# Patient Record
Sex: Male | Born: 1935 | Race: White | Hispanic: No | Marital: Married
Health system: Southern US, Community
[De-identification: ages and names within clinical notes are randomized; demographics above are authoritative.]

---

## 2003-10-19 ENCOUNTER — Other Ambulatory Visit: Payer: Self-pay

## 2004-07-27 ENCOUNTER — Encounter: Payer: Self-pay | Admitting: Rheumatology

## 2005-04-14 ENCOUNTER — Emergency Department: Payer: Self-pay | Admitting: Internal Medicine

## 2005-07-18 ENCOUNTER — Ambulatory Visit: Payer: Self-pay | Admitting: Internal Medicine

## 2006-05-21 ENCOUNTER — Ambulatory Visit: Payer: Self-pay | Admitting: Internal Medicine

## 2007-06-23 ENCOUNTER — Inpatient Hospital Stay: Payer: Self-pay | Admitting: Internal Medicine

## 2007-08-17 ENCOUNTER — Inpatient Hospital Stay: Payer: Self-pay | Admitting: Internal Medicine

## 2007-08-17 ENCOUNTER — Other Ambulatory Visit: Payer: Self-pay

## 2008-10-22 ENCOUNTER — Ambulatory Visit: Payer: Self-pay | Admitting: Gastroenterology

## 2010-09-18 ENCOUNTER — Ambulatory Visit: Payer: Self-pay | Admitting: Cardiology

## 2010-10-10 ENCOUNTER — Encounter: Payer: Self-pay | Admitting: Cardiology

## 2010-10-28 ENCOUNTER — Encounter: Payer: Self-pay | Admitting: Cardiology

## 2010-11-26 ENCOUNTER — Inpatient Hospital Stay: Payer: Self-pay | Admitting: Internal Medicine

## 2010-11-29 ENCOUNTER — Inpatient Hospital Stay: Payer: Self-pay | Admitting: Gastroenterology

## 2010-12-01 ENCOUNTER — Encounter: Payer: Self-pay | Admitting: Cardiology

## 2010-12-28 ENCOUNTER — Encounter: Payer: Self-pay | Admitting: Cardiology

## 2011-09-07 ENCOUNTER — Ambulatory Visit: Payer: Self-pay | Admitting: Internal Medicine

## 2012-09-27 ENCOUNTER — Inpatient Hospital Stay: Payer: Self-pay | Admitting: Internal Medicine

## 2012-09-27 LAB — BASIC METABOLIC PANEL
Anion Gap: 9 (ref 7–16)
BUN: 32 mg/dL — ABNORMAL HIGH (ref 7–18)
Calcium, Total: 9.1 mg/dL (ref 8.5–10.1)
Chloride: 107 mmol/L (ref 98–107)
Creatinine: 1.5 mg/dL — ABNORMAL HIGH (ref 0.60–1.30)
EGFR (African American): 51 — ABNORMAL LOW
Sodium: 140 mmol/L (ref 136–145)

## 2012-09-27 LAB — CBC
MCH: 35.5 pg — ABNORMAL HIGH (ref 26.0–34.0)
MCHC: 34.8 g/dL (ref 32.0–36.0)
Platelet: 264 10*3/uL (ref 150–440)
RBC: 3.96 10*6/uL — ABNORMAL LOW (ref 4.40–5.90)

## 2012-09-28 LAB — PROTIME-INR
INR: 1.1
Prothrombin Time: 14.5 secs (ref 11.5–14.7)

## 2012-09-28 LAB — CBC WITH DIFFERENTIAL/PLATELET
Basophil #: 0.1 10*3/uL (ref 0.0–0.1)
Eosinophil %: 3 %
HCT: 34.9 % — ABNORMAL LOW (ref 40.0–52.0)
HGB: 12 g/dL — ABNORMAL LOW (ref 13.0–18.0)
Lymphocyte %: 28 %
MCH: 35 pg — ABNORMAL HIGH (ref 26.0–34.0)
MCHC: 34.3 g/dL (ref 32.0–36.0)
Neutrophil #: 5 10*3/uL (ref 1.4–6.5)
Neutrophil %: 58.3 %
RBC: 3.43 10*6/uL — ABNORMAL LOW (ref 4.40–5.90)
RDW: 13.9 % (ref 11.5–14.5)

## 2012-09-28 LAB — BASIC METABOLIC PANEL
Anion Gap: 8 (ref 7–16)
Chloride: 110 mmol/L — ABNORMAL HIGH (ref 98–107)
Co2: 24 mmol/L (ref 21–32)
Creatinine: 1.44 mg/dL — ABNORMAL HIGH (ref 0.60–1.30)
EGFR (Non-African Amer.): 46 — ABNORMAL LOW
Glucose: 139 mg/dL — ABNORMAL HIGH (ref 65–99)
Osmolality: 293 (ref 275–301)
Potassium: 4.1 mmol/L (ref 3.5–5.1)
Sodium: 142 mmol/L (ref 136–145)

## 2012-09-28 LAB — HEMOGLOBIN
HGB: 11.2 g/dL — ABNORMAL LOW (ref 13.0–18.0)
HGB: 10.7 g/dL — ABNORMAL LOW (ref 13.0–18.0)
HGB: 10.7 g/dL — ABNORMAL LOW (ref 13.0–18.0)

## 2012-09-29 LAB — CBC WITH DIFFERENTIAL/PLATELET
Eosinophil #: 0.1 10*3/uL (ref 0.0–0.7)
Eosinophil %: 1.2 %
HCT: 29.8 % — ABNORMAL LOW (ref 40.0–52.0)
Lymphocyte #: 1.2 10*3/uL (ref 1.0–3.6)
Lymphocyte %: 11.8 %
MCH: 35.6 pg — ABNORMAL HIGH (ref 26.0–34.0)
MCHC: 35.1 g/dL (ref 32.0–36.0)
MCV: 102 fL — ABNORMAL HIGH (ref 80–100)
Monocyte %: 8.8 %
Neutrophil #: 7.7 10*3/uL — ABNORMAL HIGH (ref 1.4–6.5)
Neutrophil %: 77.5 %
Platelet: 198 10*3/uL (ref 150–440)
RBC: 2.93 10*6/uL — ABNORMAL LOW (ref 4.40–5.90)

## 2012-09-29 LAB — HEMOGLOBIN
HGB: 10.4 g/dL — ABNORMAL LOW (ref 13.0–18.0)
HGB: 10.2 g/dL — ABNORMAL LOW (ref 13.0–18.0)

## 2012-09-29 LAB — BASIC METABOLIC PANEL
Anion Gap: 6 — ABNORMAL LOW (ref 7–16)
BUN: 30 mg/dL — ABNORMAL HIGH (ref 7–18)
Co2: 24 mmol/L (ref 21–32)
Creatinine: 1.31 mg/dL — ABNORMAL HIGH (ref 0.60–1.30)
EGFR (African American): 60 — ABNORMAL LOW
Glucose: 120 mg/dL — ABNORMAL HIGH (ref 65–99)
Osmolality: 291 (ref 275–301)
Potassium: 3.9 mmol/L (ref 3.5–5.1)

## 2012-09-30 LAB — CBC WITH DIFFERENTIAL/PLATELET
Basophil %: 0.6 %
Eosinophil %: 2.6 %
HCT: 27.6 % — ABNORMAL LOW (ref 40.0–52.0)
HGB: 9.7 g/dL — ABNORMAL LOW (ref 13.0–18.0)
Lymphocyte %: 14.4 %
MCH: 35.8 pg — ABNORMAL HIGH (ref 26.0–34.0)
MCHC: 35.2 g/dL (ref 32.0–36.0)
Neutrophil #: 6.7 10*3/uL — ABNORMAL HIGH (ref 1.4–6.5)
Neutrophil %: 72.1 %
Platelet: 180 10*3/uL (ref 150–440)
RBC: 2.71 10*6/uL — ABNORMAL LOW (ref 4.40–5.90)
RDW: 13.7 % (ref 11.5–14.5)
WBC: 9.3 10*3/uL (ref 3.8–10.6)

## 2012-09-30 LAB — BASIC METABOLIC PANEL
Anion Gap: 7 (ref 7–16)
Chloride: 110 mmol/L — ABNORMAL HIGH (ref 98–107)
Creatinine: 1.21 mg/dL (ref 0.60–1.30)
EGFR (African American): >60
Potassium: 3.3 mmol/L — ABNORMAL LOW (ref 3.5–5.1)

## 2012-09-30 LAB — POTASSIUM: Potassium: 3.8 mmol/L (ref 3.5–5.1)

## 2012-10-01 LAB — CBC WITH DIFFERENTIAL/PLATELET
Basophil #: 0 10*3/uL (ref 0.0–0.1)
Eosinophil #: 0.2 10*3/uL (ref 0.0–0.7)
Eosinophil %: 2.7 %
HCT: 28.2 % — ABNORMAL LOW (ref 40.0–52.0)
HGB: 9.9 g/dL — ABNORMAL LOW (ref 13.0–18.0)
MCHC: 35.1 g/dL (ref 32.0–36.0)
MCV: 102 fL — ABNORMAL HIGH (ref 80–100)
Monocyte %: 9.9 %
Neutrophil #: 6.6 10*3/uL — ABNORMAL HIGH (ref 1.4–6.5)
Neutrophil %: 73.7 %
RDW: 13.8 % (ref 11.5–14.5)

## 2012-10-01 LAB — BASIC METABOLIC PANEL
BUN: 21 mg/dL — ABNORMAL HIGH (ref 7–18)
EGFR (African American): >60

## 2012-10-03 LAB — CBC WITH DIFFERENTIAL/PLATELET
Basophil #: 0.1 10*3/uL (ref 0.0–0.1)
Basophil %: 1.2 %
HGB: 10.2 g/dL — ABNORMAL LOW (ref 13.0–18.0)
Lymphocyte #: 1.5 10*3/uL (ref 1.0–3.6)
Lymphocyte %: 21.8 %
MCH: 35.4 pg — ABNORMAL HIGH (ref 26.0–34.0)
MCV: 101 fL — ABNORMAL HIGH (ref 80–100)
Monocyte #: 0.8 x10 3/mm (ref 0.2–1.0)
Monocyte %: 11.4 %
Neutrophil #: 4.1 10*3/uL (ref 1.4–6.5)
Platelet: 262 10*3/uL (ref 150–440)
RDW: 14.1 % (ref 11.5–14.5)
WBC: 6.7 10*3/uL (ref 3.8–10.6)

## 2012-10-03 LAB — URINALYSIS, COMPLETE
Glucose,UR: NEGATIVE mg/dL (ref 0–75)
Nitrite: NEGATIVE
Ph: 5 (ref 4.5–8.0)
RBC,UR: 5 /HPF (ref 0–5)
WBC UR: 8 /HPF (ref 0–5)

## 2012-10-05 LAB — URINE CULTURE

## 2012-11-26 DEATH — deceased

## 2014-06-18 NOTE — Op Note (Signed)
PATIENT NAME:  Michael Barnes, Michael Barnes MR#:  [id number] DATE OF BIRTH:  [birth year]  DATE OF PROCEDURE:  09/28/2012  PREOPERATIVE DIAGNOSES:  GI bleed.   POSTOPERATIVE DIAGNOSIS:  GI bleed.  PROCEDURE PERFORMED:  1.  Selective injection of the middle colic artery.  2.  Selective injection of the right colic artery.  3.  Embolization of the middle colic artery using 1 mL of 3 to 500 micron PVC beads.   PROCEDURE PERFORMED BY:  Levora Dredge, MD  SEDATION:  Fentanyl 2 mg plus fentanyl 100 mcg administered IV. Continuous ECG, pulse oximetry and cardiopulmonary monitoring was performed throughout the entire procedure by the interventional radiology nurse. Total sedation time is approximately 1 hour 20 minutes.   ACCESS:  A 5 French sheath, right common femoral artery.   FLUOROSCOPY TIME:  Approximately 8 minutes.   CONTRAST USED:  Isovue 80 mL.   INDICATIONS: Michael Barnes is a 81 year old gentleman who presented to the hospital with a significant GI bleed. Over the course of the night, his bleeding has not slowed. He is now showing increasing hemodynamic changes and a decrease in his crit. Bleeding scan is positive for the hepatic flexure, and he is therefore undergoing evaluation for possible embolization. The risks and benefits were described to the patient. All questions have been answered. The patient agrees for [place] to proceed.   DESCRIPTION OF PROCEDURE:  The patient is taken to special procedures and placed in the supine position. After adequate sedation is achieved, the right groin is prepped and draped in sterile fashion. Ultrasound is placed in a sterile sleeve. Ultrasound is utilized secondary to lack of appropriate landmarks and to avoid vascular injury. Under direct ultrasound visualization, the common femoral artery is identified. It is echolucent, homogeneous, indicating patency. Image is recorded for the permanent record. A micropuncture needle is used to access the anterior wall  under direct visualization. Microwire followed by micro-sheath, J-wire followed by a 5 French sheath and 5 French pigtail catheter. Lateral projection of the aorta is obtained with the pigtail catheter positioned in approximately T12, and the origin of the SMA is identified.   The Glidewire is then advanced, and the pigtail catheter is exchanged for a VS-1 catheter. VS-1 catheter is then used to engage the SMA, and the wire is advanced. The VS1 is then advanced  down to the SMA. Selective injection of the SMA is made in the AP projection. Right colic and middle colic arteries are identified. The middle colic is selected first and the catheter is advanced out into the midportion of the middle colic, and magnified images of the hepatic flexure are taken. Blush actually is identified, and 1 mL of 3 to 500 micron PVC beads is then infused. Followup angiography demonstrates a moderate reduction in the flow, and there is no longer a blush noted on hand injection.   The catheter is then repositioned into the right colic, and hand injection of contrast is utilized to demonstrate the distal vessels. No blushes are identified on this imaging, and therefore the procedure is terminated, having identified a bleeding source. I see no reason or indication to evaluate the left colon, as this was not the area found to be bleeding by the scan.   The catheter is removed over a wire, oblique view is obtained, and a Mynx device is deployed without difficulty. Palpable pulses are maintained at the completion of the procedure in the patient's right foot.   INTERPRETATION: Initial views of the SMA demonstrate normal pattern.  No evidence of hemodynamically significant atherosclerotic changes. On selective injection of the SMA, there does appear to be a bleeding focus, and this is treated with an injection of beads, and resolution of this area. Right injection does not demonstrate any bleeding.   SUMMARY: Successful embolization  for treatment of GI bleed.    ____________________________ Renford DillsGregory G. Shaylyn Bawa, MD ggs:mr D: 09/28/2012 16:24:29 ET T: 09/28/2012 21:25:33 ET JOB#: [id number]  cc: Renford DillsGregory G. Brean Carberry, MD, <Dictator> Hope PigeonVaibhavkumar G. Elisabeth PigeonVachhani, MD Dow AdolphMatthew Rein, MD   Renford DillsGREGORY G Kenyatta Gloeckner MD ELECTRONICALLY SIGNED 10/07/2012 8:50

## 2014-06-18 NOTE — Discharge Summary (Signed)
PATIENT NAME:  Michael Barnes, Michael R MR#:  [id number] DATE OF BIRTH:  Sep 27, 1931  DATE OF ADMISSION:  09/27/2012 DATE OF DISCHARGE: 10/03/2012    DIAGNOSES AT TIME OF DISCHARGE:  1. Acute lower gastrointestinal bleed, most likely secondary to diverticulosis.  2. Embolization of the middle colic artery using polyvinyl chloride beads.  3. Type 2 diabetes.  4. Parkinson's disease.  5. Confusion, mild dementia.   CHIEF COMPLAINT: Acute bright red blood per rectum.    HISTORY OF PRESENT ILLNESS: Michael Barnes is a 81 year old male who presented to the hospital after having 2 episodes of bright red blood per rectum. The patient also had been complaining of some cramping abdominal pain and went to the bathroom and stated that he passed a large amount of blood, and the patient also reports a sense of weakness. Denies any fevers, chills, cough, chest pain or shortness of breath.   PAST MEDICAL HISTORY: Significant for:  1. Prostate cancer.  2. Osteoarthritis.  3. Type 2 diabetes.  4. Gout.  5. Hypertension.  6. History of CAD status post stent placement.  7. History of GI bleed with multiple transfusions in the past.   PHYSICAL EXAMINATION:  VITAL SIGNS: Temperature 97.6, pulse was 80, respirations 20, blood pressure 102/45, O2 saturation is 96% on room air.  GENERAL: He was not in distress.  HEENT: [place], AT.  NECK: Supple.  HEART: S1, S2. No murmurs or rubs.  LUNGS: Clear to auscultation.  ABDOMEN: Soft, nontender.  EXTREMITIES: No edema.   LABORATORY STUDIES: Sugar was 150, BUN 32, creatinine 1.5, sodium 140, potassium 4.6, chloride 107, bicarbonate 24. Troponin less than 0.02. WBC count 9.4, hemoglobin 14, hematocrit 40.3 and platelets 264.   HOSPITAL COURSE: The patient was seen in consultation by Dr. Gilda CreaseSchnier and underwent selective injection of the middle colic artery and selective injection of the right colic artery and embolization of the middle colic artery using 1 mL of 3 to 500  micron PVC beads. The patient tolerated the procedure well. He was also seen by gastroenterologist, Dr. Dow AdolphMatthew Rein. The patient remained stable post procedure, and his hemoglobin, although it went down to 9.7, subsequently picked back up to 10.2. His diet was also advanced. He did have some episodes of confusion, which appeared to get better. Urinalysis was negative for infection. The patient did receive some Haldol for episodic confusion, but was stable at the time of discharge. He was discharged in stable condition on the following medications.   DISCHARGE MEDICATIONS:  1. Allopurinol 300 mg once a day.  2. Gemfibrozil 600 mg b.i.d. 3. Oxybutynin 5 mg b.i.d. 4. Glipizide 1.25 mg once a day.  5. Carbidopa/levodopa 25/100 t.i.d. with meals. 6. Ranitidine 150 mg q.12.  7. Acetaminophen 325 every 4 to 6 hours p.r.n. for temperature greater 100.4.   FOLLOWUP: He has been advised to follow up with Dr. Sampson GoonFitzgerald in 1 to 2 weeks. Also advised to follow up with Dr. Gilda CreaseSchnier in 1 to 2 weeks and keep his followup appointment with orthopedics as scheduled.   TOTAL TIME TAKEN FOR DISCHARGE AND COORDINATION OF CARE: 45 minutes.   ____________________________ Barbette ReichmannVishwanath Demere Dotzler, MD vh:OSi D: 10/03/2012 13:18:47 ET T: 10/03/2012 13:33:18 ET JOB#: [id number]  cc: Barbette ReichmannVishwanath Gudrun Axe, MD, <Dictator> Barbette ReichmannVISHWANATH Valentina Alcoser MD ELECTRONICALLY SIGNED 10/06/2012 18:47

## 2014-06-18 NOTE — Consult Note (Signed)
PATIENT NAME:  Michael Barnes, Michael R MR#:  [id number] DATE OF BIRTH:  1932-02-01  DATE OF CONSULTATION:  09/28/2012  REFERRING PHYSICIAN:  Hilda LiasVivek Sainani, MD CONSULTING PHYSICIAN:  Dow AdolphMatthew Yancy Knoble, MD  REASON FOR CONSULTATION:  Rectal bleeding.  HISTORY OF PRESENT ILLNESS:  Michael Barnes is a 81 year old male with a past medical history notable for prostate cancer, diabetes, coronary disease, status post stent, history of multiple lower GI bleeds requiring transfusion, last bleed in 2012, now presenting again for rectal bleeding.  This history is per the wife as Michael Barnes is unable to give a history at this time.  Per the wife, they were out eating dinner at a restaurant yesterday evening and the patient developed sudden onset of rectal bleeding.  She brought immediately to the emergency room.  In the emergency room, he continued to have several large episodes of rectal bleeding.  He was admitted to the intensive care unit.  He had another 1 to 2 further episodes of bleeding overnight and into the morning.  He did remain hemodynamically stable.  His hemoglobin dropped from 14 to 12 during the course of this period.  He did go for embolization this morning.  The embolization was successful for a treated embolization of the bleeding source in the vascular territory consistent with the hepatic flexure.    Of note, Michael Barnes has had several previous episodes of hematochezia.  He has been seen by Dr. Marva PandaSkulskie for this.  His last episode was in 2012.  It appears, based on the records, that his last colonoscopy was in 2010 and he did have multiple diverticula at this point but they were unable to locate a source of the bleeding at that time.    PAST MEDICAL HISTORY 1.  Prostate cancer. 2.  OA. 3.  DM. 4.  Gout. 5.  Hypertension.   6.  Coronary disease. 7.  Recurrent lower GI bleeding.   8.  Diverticulosis.    ALLERGIES:  PROTONIX, ANAPHYLAXIS.  SOCIAL HISTORY:  No alcohol, tobacco or  recreational drugs.  FAMILY HISTORY:  No family history of GI malignancy that the wife is aware of.   ADMISSION MEDICATIONS:  Ranitidine 300 q.h.s., oxybutynin 5 mg b.i.d., glipizide 1.25 daily, gemfibrozil 600 b.i.d., carbidopa/levodopa 25/100 half tab b.i.d., allopurinol 300 mg daily, acetaminophen 500 mg daily  REVIEW OF SYSTEMS:  Unable to obtain due to the patient recovering from sedation.  PHYSICAL EXAM VITAL SIGNS:  Current blood pressure 158/72, heart rate is 94, temperature is 97.5, he is 99% on room air.   GENERAL: Lethargic, alert and oriented times 1.  NAD. HEENT: Normocephalic/atraumatic. Extraocular movements are intact. Anicteric. NECK: Soft, supple. JVP appears normal. No adenopathy. CHEST: Clear to auscultation. No wheeze or crackle. Respirations unlabored. HEART: Regular. No murmur, rub, or gallop.  Normal S1 and S2. ABDOMEN: Hypogastric surgical scar, normoactive bowel sounds, soft, no rebound or guarding.   EXTREMITIES: No swelling or edema, compression in the right groin at the site of embolization. SKIN: No rash or lesion. Skin color, texture, turgor normal. NEUROLOGICAL: Grossly intact. PSYCHIATRIC: Normal tone and affect. MUSCULOSKELETAL: No joint swelling or erythema.   LABORATORY DATA:   Sodium 142, potassium 4.1, BUN 35, creatinine 1.4, bicarb 24, chloride 110.  Troponin is negative.  White count 8.6, current hemoglobin 11.2, platelets are 214.    IMAGING:  Tagged red blood cell scan shows likely bleeding source in the hepatic flexure.  ASSESSMENT AND PLAN:  Lower gastrointestinal bleeding:  This is likely a diverticular  bleed based on the patient's previous history.  Fortunately, he has remained hemodynamically stable and his hemoglobin has only declined from 14 to 11 so far.  The decision was made to send him for an embolization given the unprepped nature of the colon and the difficulty with visualization in the setting of stool and blood.  This was a successful  procedure with embolization in the area of the right hepatic artery.  At this point, we will continue to follow his hemoglobin and his hemodynamics.  If he continues to have bleeding or further drop in his hemoglobin beyond what would be expected, then we would proceed with a colonoscopy prep and a colonoscopy.  If he does not continue to have bleeding, then likely a colonoscopy would not be indicated during this hospital admission.  Further management pending the clinical course at this time.    Thank you for the consult.  We will continue to follow.   ____________________________ Dow Adolph, MD mr:cs D: 09/28/2012 13:52:00 ET T: 09/28/2012 14:18:31 ET JOB#: [id number]  cc: Dow Adolph, MD, <Dictator> Kathalene Frames MD ELECTRONICALLY SIGNED 09/29/2012 22:59

## 2014-06-18 NOTE — H&P (Signed)
PATIENT NAME:  Michael Barnes, Michael Barnes MR#:  [id number] DATE OF BIRTH:  12-02-1931  DATE OF ADMISSION:  09/27/2012  PRIMARY CARE PHYSICIAN:  Dr. Clydie Braun  CHIEF COMPLAINT:  Acute bright red blood per rectum.   HISTORY OF PRESENT ILLNESS: This is a 81 year old male who presented to the hospital after having 2 episodes of bright red blood per rectum. The patient apparently had gone out to dinner with his wife, was having some crampy abdominal pain, went to the bathroom and passed a large amount of blood, which was bright in nature and also consistent with a Jell-O like consistency. The patient has a previous history of a diverticular bleed with multiple transfusions in the past. His wife brought him to the ER. On his way to the hospital, patient had another episode of bright red blood per rectum. The patient presently denies any chest pain, any shortness of breath, any nausea, vomiting, abdominal pain, fevers, chills, cough, any other associated symptoms. Hospitalist services were contacted for further treatment and evaluation.   REVIEW OF SYSTEMS: CONSTITUTIONAL: No documented fever. No weight gain or weight loss.  EYES: No blurry or double vision.  EARS, NOSE, THROAT:  No tinnitus. No postnasal drip. No redness of the oropharynx.  RESPIRATORY: No cough, no wheeze, no hemoptysis, no dyspnea.  CARDIOVASCULAR: No chest pain. No orthopnea. No palpitations. No syncope.  GASTROINTESTINAL: No nausea, no vomiting, no diarrhea. Positive hematochezia. No melena.  GENITOURINARY:  No dysuria, no hematuria.  ENDOCRINE:  No polyuria or nocturia. No heat or cold intolerance.  HEMATOLOGIC:  No anemia, no bruising, no bleeding.  INTEGUMENTARY: No rashes. No lesions.  MUSCULOSKELETAL: No arthritis. No swelling. No gout.  NEUROLOGIC: No numbness or tingling. No ataxia. No seizure-type activity.  PSYCHIATRIC: No anxiety, no insomnia. No ADD.   PAST MEDICAL HISTORY:  Consistent with history of prostate  cancer, osteoarthritis, diabetes, gout, hypertension, history of coronary artery disease, status post stent placement. History of GI bleeding, receiving multiple transfusions. Likely a diverticular bleed.   ALLERGIES: PROTONIX, which causes anaphylaxis.   SOCIAL HISTORY: Used to be a smoker many years ago. No alcohol abuse. No illicit drug abuse. Lives at home with his wife.   FAMILY HISTORY:  The patient's father died from a stroke. Mother died from a stomach rupture.   CURRENT MEDICATIONS ARE AS FOLLOWS: Ranitidine 300 mg at bedtime as needed, oxybutynin 5 mg b.i.d., glipizide 1.25 mg daily, gemfibrozil 600 mg b.i.d., carbidopa/levodopa 25/100, 1/2 tablets b.i.d., allopurinol 300 mg daily, acetaminophen 500 mg daily as needed.   PHYSICAL EXAMINATION: VITAL SIGNS ARE NOTED TO BE: Temperature is 97.6, pulse 80, respirations 20, blood pressure 102/45, sats 96% on room air.  GENERAL: The patient is a lethargic-appearing male, but in no apparent distress.  HEAD, EYES, EARS, NOSE, THROAT EXAM: Atraumatic, normocephalic. Extraocular muscles are intact. Pupils are equal and react to light. Sclerae anicteric. No conjunctival injection. No pharyngeal erythema.  NECK: Supple. No jugular venous distention, no bruits, no lymphadenopathy, no thyromegaly.  HEART EXAM: Regular rate and rhythm. No murmurs. No rubs. No clicks.  LUNGS:  Clear to auscultation bilaterally. No rales, no rhonchi, no wheezes.  ABDOMEN: Soft, flat, nontender, nondistended. Has good bowel sounds. No hepatosplenomegaly appreciated.  EXTREMITIES:  No evidence of any cyanosis, clubbing, or peripheral edema. Has +2 pedal and radial pulses bilaterally.  NEUROLOGICAL: The patient is alert, awake, oriented x 3, with no focal motor or sensory deficits appreciated bilaterally.  SKIN:  Moist and warm, with no rashes  appreciated.  LYMPHATIC: There is no cervical or axillary lymphadenopathy.   LABORATORY EXAM:  Showed a serum glucose of 150, BUN  32, creatinine 1.5, sodium 140, potassium 4.6, chloride 107, bicarb 24. Troponin less than 0.02. White cell count 9.4, hemoglobin 14.0, hematocrit 40.3, platelet count 264.   ASSESSMENT AND PLAN: This is a 81 year old male with a history of coronary artery disease, status post stent, hypertension, history of previous GI bleeding secondary to diverticulosis, urinary incontinence, GERD, Parkinson's disease, gout, diabetes, who presents to the hospital due to acute lower gastrointestinal bleed with 2 episodes of bright red blood per rectum.   1.  Acute lower gastrointestinal bleed. This is likely a diverticular bleed, as patient has had similar symptoms and admissions in the past. The patient's hemoglobin is currently stable and he is hemodynamically stable. The patient is pending a nuclear medicine bleeding scan, and will follow that up. For now, I will follow serial hemoglobins. He will be typed and screened and crossmatched for 2 units of packed red blood cells. I will admit him to the Intensive Care Unit under stepdown level of care, and follow hemodynamics. Will also get a GI consult, case was discussed by the ER physician with Dr. Alycia Rossettiyan, who was on call.   2.  Parkinson's disease. I will continue his Sinemet.   3.  Diabetes. Since patient is going to be n.p.o. I will place him on sliding scale insulin.  4.  Urinary incontinence. Continue oxybutynin.   5.  Gastroesophageal reflux disease. Continue ranitidine.   6.  The patient is a FULL CODE.   The patient will be transferred over to Dr. Jarrett AblesFitzgerald's service.   TIME SPENT: 50 minutes.    ____________________________ Rolly PancakeVivek J. Cherlynn KaiserSainani, MD vjs:mr D: 09/27/2012 21:42:37 ET T: 09/27/2012 22:31:10 ET JOB#: [id number]  cc: Rolly PancakeVivek J. Cherlynn KaiserSainani, MD, <Dictator> Houston SirenVIVEK J SAINANI MD ELECTRONICALLY SIGNED 09/29/2012 14:46

## 2014-06-18 NOTE — Consult Note (Signed)
Brief Consult Note: Diagnosis: lower GI bleed.   Patient was seen by consultant.   Consult note dictated.   Recommend to proceed with surgery or procedure.   Comments: Mr. Michael Barnes has had multiiple episodes of brbpr overnight.  He has remainded hemodynamically stable and his Hgb has dropped from 14 to 11.  He underwent successful embolization this morning and is now in the CCU in stable condition.  Please cont to monitor his hemodynamics and Hgb.  He may need colonscopy if he continues to have rectal bleeding.  Will cont to follow.  Electronic Signatures: Dow Adolphein, Artemis Loyal (MD)  (Signed 03-Aug-14 12:56)  Authored: Brief Consult Note   Last Updated: 03-Aug-14 12:56 by Dow Adolphein, Kairy Folsom (MD)

## 2014-06-18 NOTE — Consult Note (Signed)
Present Illness The patient is a 81 year old male that came to [facility] after 2 episodes of bright red blood per rectum.  The patient has a previous history of a diverticular bleed with multiple transfusions in the past. His wife brought him to the ER. On his way to the hospital, patient had another episode of bright red blood per rectum. The patient presently denies any chest pain, any shortness of breath, any nausea, vomiting, abdominal pain, fevers, chills, cough, any other associated symptoms. Hospitalist services were contacted for further treatment and evaluation.  This morning he had yet another large bloody BM. He has a + bleeding scan in the hepatic flexure.   PAST MEDICAL HISTORY:  Consistent with history of prostate cancer, osteoarthritis, diabetes, gout, hypertension, history of coronary artery disease, status post stent placement. History of GI bleeding, receiving multiple transfusions. Likely a diverticular bleed.   Home Medications: Medication Instructions Status  gemfibrozil tablet 600 mg 1 tab(s) orally 2 times a day  Active  acetaminophen 500 mg oral tablet 1  orally  PRN   Active  allopurinol 300 mg oral tablet 1  orally once a day  Active  oxybutynin 5 mg oral tablet 1 tab(s) orally 2 times a day Active  ranitidine 300 mg oral tablet 1 tab(s) orally once a day (at bedtime), As Needed Active  glipiZIDE 1.25 milligram(s) orally once a day Active  carbidopa-levodopa 25-100 tid wm Active    Protonix: Resp. Distress  Case History:  Family History Non-Contributory   Social History negative tobacco, negative ETOH, negative Illicit drugs   Review of Systems:  Fever/Chills No   Cough No   Sputum No   Abdominal Pain No   Diarrhea bloody   Constipation No   Nausea/Vomiting No   SOB/DOE No   Chest Pain No   Telemetry Reviewed NSR   Physical Exam:  GEN well developed, well nourished, mild to moderate distresss   HEENT pale conjunctivae, PERRL, hearing intact to  voice   NECK supple  trachea midline   RESP normal resp effort  no use of accessory muscles   CARD regular rate  no JVD   ABD denies tenderness  soft  nondistended   EXTR negative cyanosis/clubbing, negative edema, 2+ DP pulses bilaterally   SKIN normal to palpation, No rashes, No ulcers   NEURO cranial nerves intact, follows commands, motor/sensory function intact   PSYCH alert, good insight   Nursing/Ancillary Notes: **Vital Signs.:   03-Aug-14 05:00  Vital Signs Type Routine  Temperature Temperature (F) 97.9  Celsius 36.6  Temperature Source oral  Pulse Pulse 80  Respirations Respirations 11  Systolic BP Systolic BP 053  Diastolic BP (mmHg) Diastolic BP (mmHg) 70  Mean BP 89  Pulse Ox % Pulse Ox % 89  Oxygen Delivery Room Air/ 21 %  Pulse Ox Heart Rate 88   Routine BB:  02-Aug-14 19:32   ABO Group + Rh Type O Positive  Antibody Screen NEGATIVE (Result(s) reported on 27 Sep 2012 at 08:32PM.)  Routine Chem:  02-Aug-14 19:32   Glucose, Serum  150  BUN  32  Creatinine (comp)  1.50  Sodium, Serum 140  Potassium, Serum 4.6  Chloride, Serum 107  CO2, Serum 24  Calcium (Total), Serum 9.1  Anion Gap 9  Osmolality (calc) 289  eGFR (African American)  51  eGFR (Non-African American)  44 (eGFR values <60mL/min/1.73 m2 may be an indication of chronic kidney disease (CKD). Calculated eGFR is useful in patients with stable  renal function. The eGFR calculation will not be reliable in acutely ill patients when serum creatinine is changing rapidly. It is not useful in  patients on dialysis. The eGFR calculation may not be applicable to patients at the low and high extremes of body sizes, pregnant women, and vegetarians.)  03-Aug-14 03:27   Glucose, Serum  139  BUN  35  Creatinine (comp)  1.44  Sodium, Serum 142  Potassium, Serum 4.1  Chloride, Serum  110  CO2, Serum 24  Calcium (Total), Serum  8.4  Anion Gap 8  Osmolality (calc) 293  eGFR (African American)  53   eGFR (Non-African American)  46 (eGFR values <5mL/min/1.73 m2 may be an indication of chronic kidney disease (CKD). Calculated eGFR is useful in patients with stable renal function. The eGFR calculation will not be reliable in acutely ill patients when serum creatinine is changing rapidly. It is not useful in  patients on dialysis. The eGFR calculation may not be applicable to patients at the low and high extremes of body sizes, pregnant women, and vegetarians.)  Cardiac:  02-Aug-14 19:32   Troponin I < 0.02 (0.00-0.05 0.05 ng/mL or less: NEGATIVE  Repeat testing in 3-6 hrs  if clinically indicated. >0.05 ng/mL: POTENTIAL  MYOCARDIAL INJURY. Repeat  testing in 3-6 hrs if  clinically indicated. NOTE: An increase or decrease  of 30% or more on serial  testing suggests a  clinically important change)  Routine Hem:  02-Aug-14 19:32   Hemoglobin (CBC) 14.0  WBC (CBC) 9.4  RBC (CBC)  3.96  Hematocrit (CBC) 40.3  Platelet Count (CBC) 264 (Result(s) reported on 27 Sep 2012 at 07:53PM.)  MCV  102  MCH  35.5  MCHC 34.8  RDW 14.0  03-Aug-14 03:27   Hemoglobin (CBC)  12.0  WBC (CBC) 8.6  RBC (CBC)  3.43  Hematocrit (CBC)  34.9  Platelet Count (CBC) 214  MCV  102  MCH  35.0  MCHC 34.3  RDW 13.9  Neutrophil % 58.3  Lymphocyte % 28.0  Monocyte % 9.7  Eosinophil % 3.0  Basophil % 1.0  Neutrophil # 5.0  Lymphocyte # 2.4  Monocyte # 0.8  Eosinophil # 0.3  Basophil # 0.1 (Result(s) reported on 28 Sep 2012 at 03:49AM.)    09:42   Hemoglobin (CBC)  11.2 (Result(s) reported on 28 Sep 2012 at 09:58AM.)   Nuclear Med:    03-Aug-14 00:05, GI Blood Loss Study - Nuc Med  GI Blood Loss Study - Nuc Med   REASON FOR EXAM:    GI BLEEDING  COMMENTS:       PROCEDURE: NM  - NM GI BLOOD LOSS STUDY  - Sep 28 2012 12:05AM     RESULT:     Technique:  Frontal scintigraphic images of the abdomen and pelvis were   obtained over 60 minutes following intravenous administration of    technetium 1m labeled red blood cells.    Findings:  Appropriate bio-distribution is appreciated within the heart,   liver, spleen and within the vasculature.     Abnormal radiotracer activity is seen in the distribution of the     transverse colon and descending colon with initial identification   appreciated in the hepatic flexure region.    IMPRESSION:     1.  Positive Nuclear Medicine GI bleeding evaluation which appears to   originate in the colonic hepatic flexure region.  2.  A preliminary faxed report was relayed on 09/28/2012 at 12:47 AM,  Eastern Time.  2.  Dr. Lenore Manner was informed of these findings via telephone conversation   on 09/28/2012 at 1:01 AM, [place] Time.        Thank you for this opportunity to contribute to the care of your patient.     Verified By: Mikki Santee, M.D., MD    Impression 1.  Acute lower gastrointestinal bleed. The patient has continued to bleed and is now increasing symptomatic.  The patient's nuclear medicine bleeding scan is positive for the hepatic flexure. He is typed and screened and crossmatched for 2 units of packed red blood cells. Given that his clinical course is worsening and he has a + bleeding scan I believe he should undergo intervention.  I have discussed angiography and embolization with the patient and he agrees to proceed.  The risks and benefits were reviewe all questions were answered.  2.  Parkinson's disease. On Sinemet.   3.  Diabetes. Patient is n.p.o. He is on sliding scale insulin.  4.  Urinary incontinence. Continue oxybutynin.   5.  Gastroesophageal reflux disease. Continue ranitidine.   Plan level 4 consult   Electronic Signatures: Hortencia Pilar (MD)  (Signed 03-Aug-14 14:42)  Authored: General Aspect/Present Illness, Home Medications, Allergies, History and Physical Exam, Vital Signs, Labs, Radiology, Impression/Plan   Last Updated: 03-Aug-14 14:42 by Hortencia Pilar (MD)

## 2015-02-08 IMAGING — XA IR VASCULAR PROCEDURE
12 series · 15 of 24 positions shown · IV contrast (IODINE)
Comparison: none

[Series 1: aorta · 1 of 2 slices shown (1 of 12)]
[im 1/2]
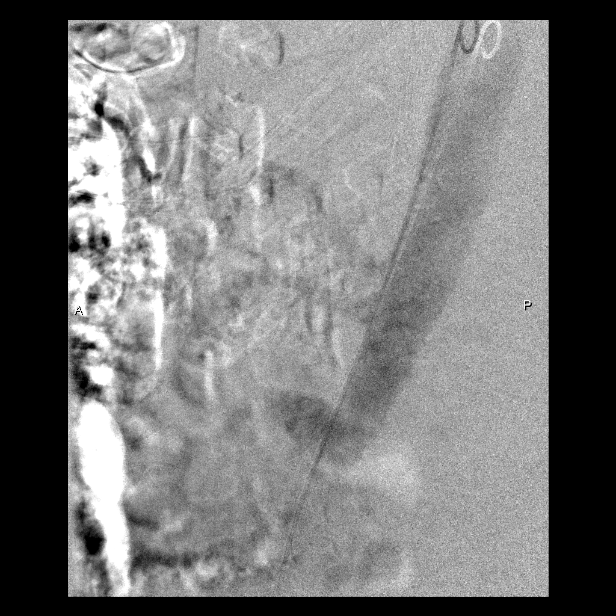

[Series 2: aorta · 1 of 2 slices shown (2 of 12)]
[im 1/2]
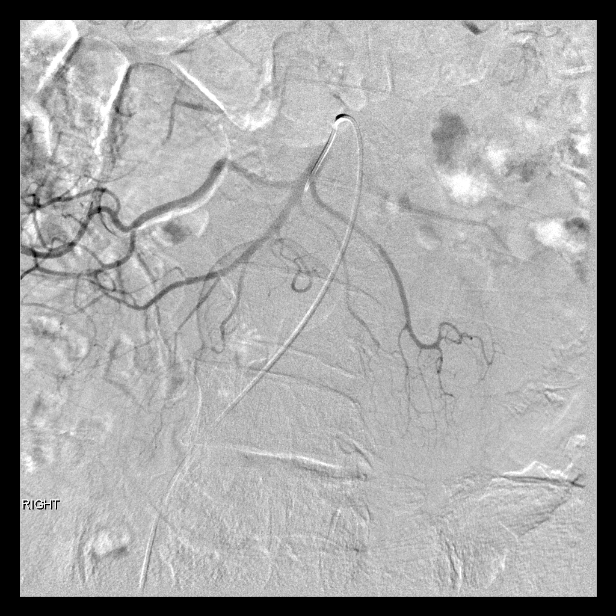

[Series 3: aorta · 2 of 2 slices shown (3 of 12)]
[im 1/2]
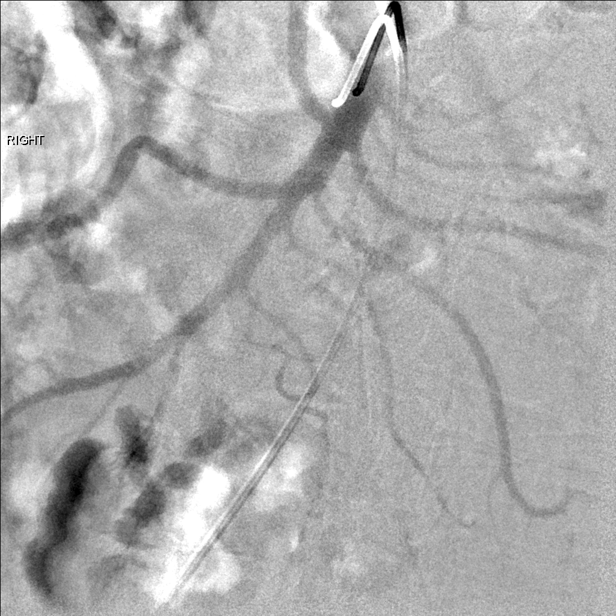
[im 2/2]
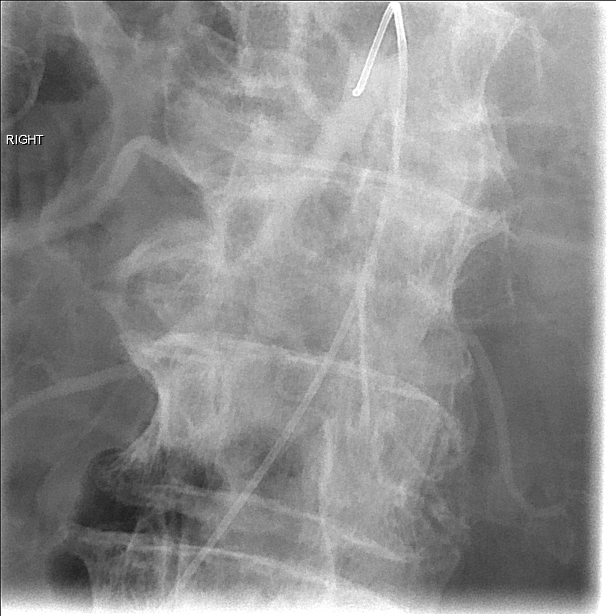

[Series 4: aorta · 1 of 2 slices shown (4 of 12)]
[im 2/2]
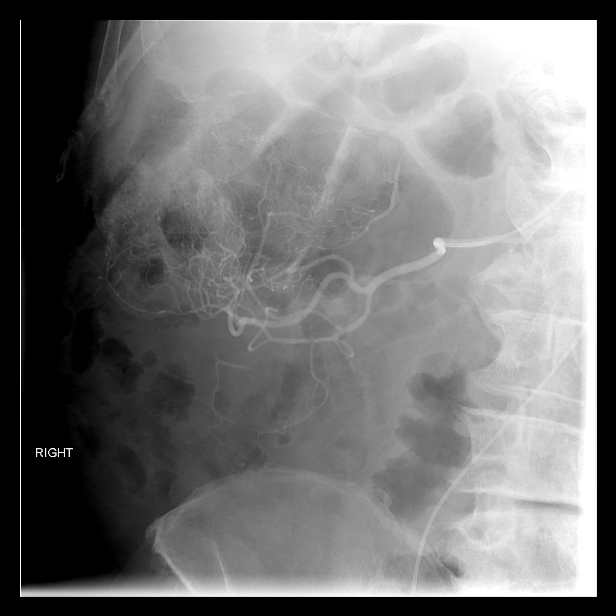

[Series 5: aorta · 1 of 2 slices shown (5 of 12)]
[im 1/2]
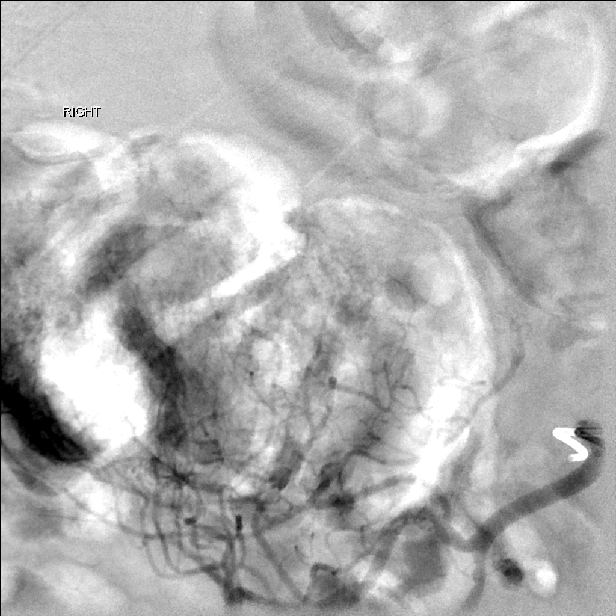

[Series 6: aorta · 1 of 2 slices shown (6 of 12)]
[im 1/2]
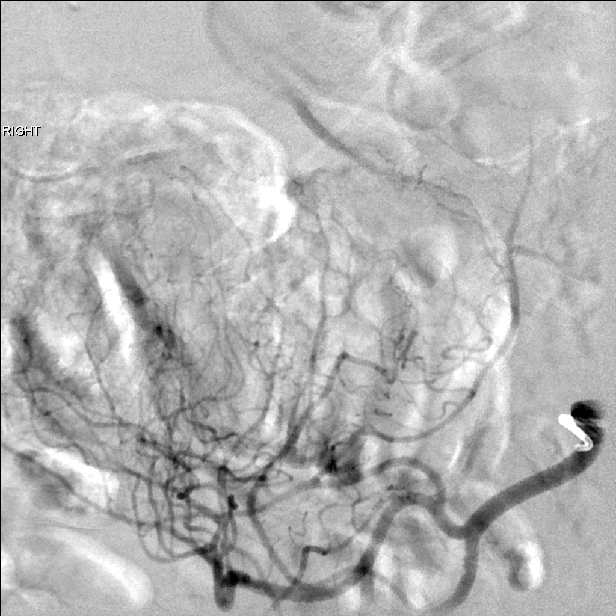

[Series 7: aorta · 2 of 2 slices shown (7 of 12)]
[im 1/2]
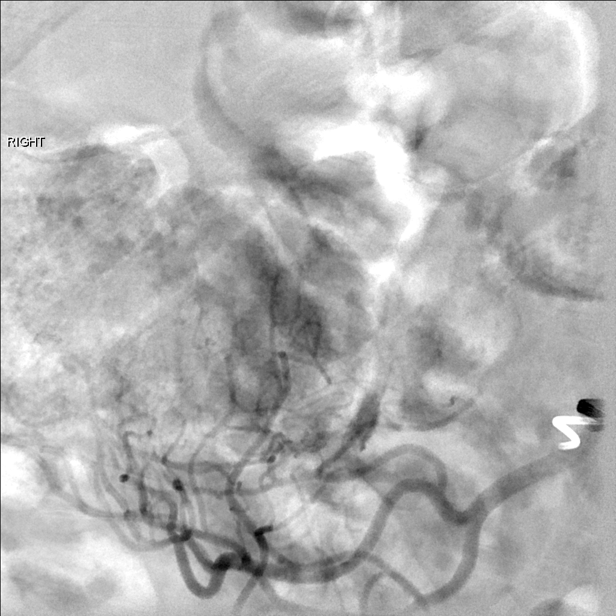
[im 2/2]
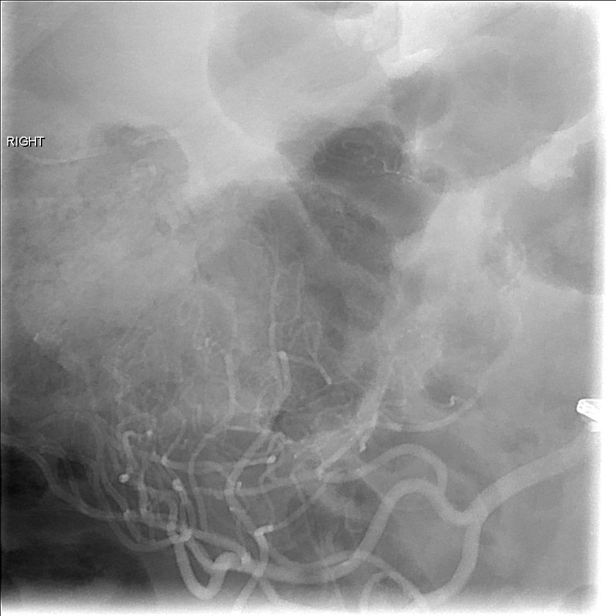

[Series 8: aorta · 1 of 2 slices shown (8 of 12)]
[im 2/2]
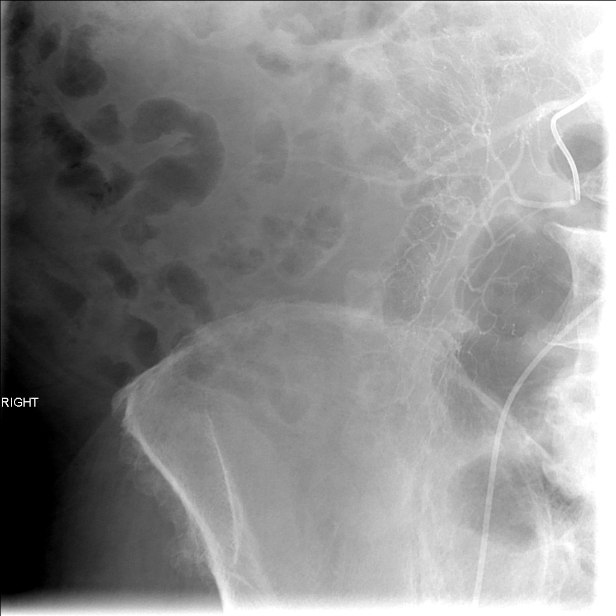

[Series 9: aorta · 1 of 2 slices shown (9 of 12)]
[im 1/2]
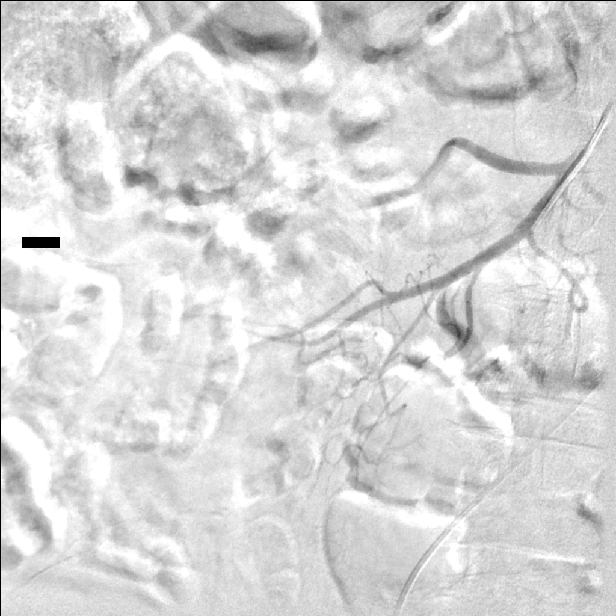

[Series 10: aorta · 1 of 2 slices shown (10 of 12)]
[im 1/2]
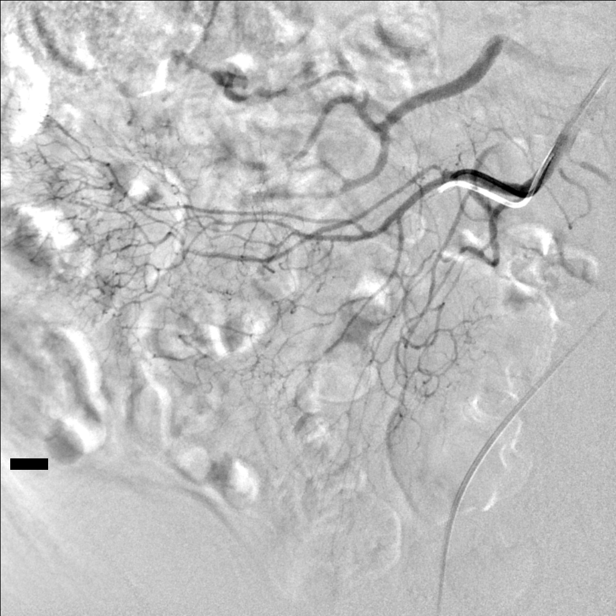

[Series 11: aorta · 2 of 2 slices shown (11 of 12)]
[im 1/2]
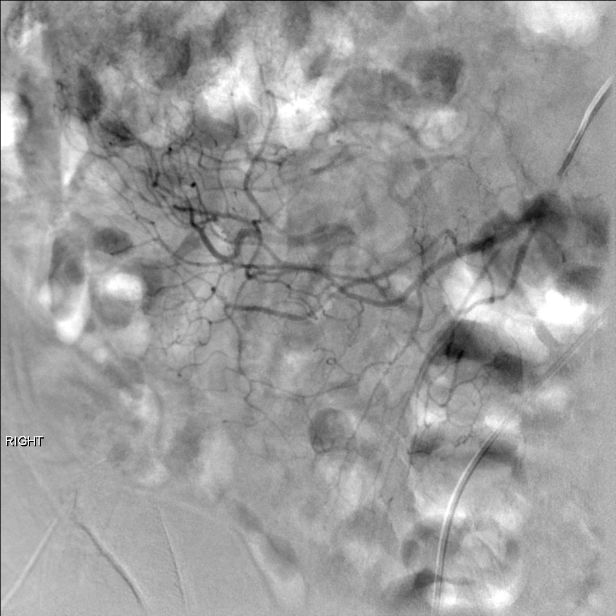
[im 2/2]
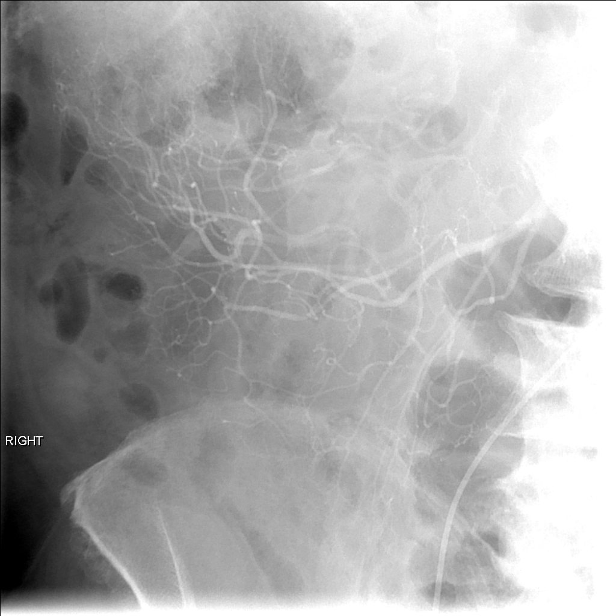

[Series 12: aorta · 1 of 2 slices shown (12 of 12)]
[im 2/2]
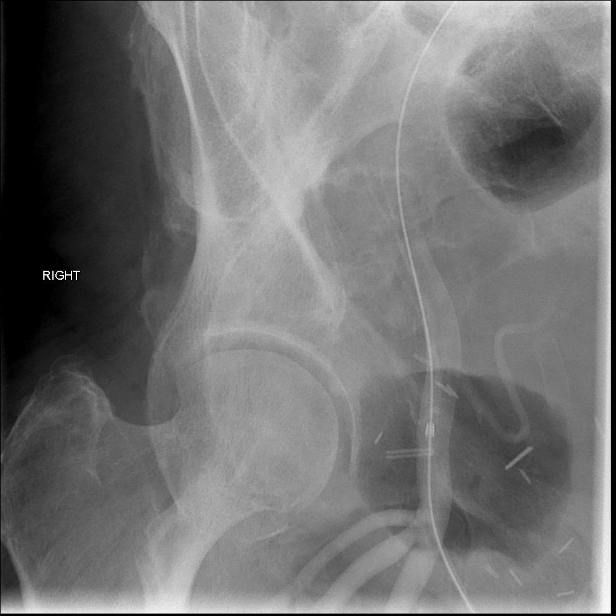

[15 of 24 positions shown; findings below may reference images not displayed]

IMAGES IMPORTED FROM THE [employer] WORKFLOW SYSTEM
NO DICTATION FOR STUDY
